# Patient Record
Sex: Female | Born: 1995 | Hispanic: No | Marital: Single | Smoking: Never smoker
Health system: Southern US, Community
[De-identification: ages and names within clinical notes are randomized; demographics above are authoritative.]

---

## 2016-08-15 ENCOUNTER — Encounter (HOSPITAL_COMMUNITY): Payer: Self-pay | Admitting: *Deleted

## 2016-08-15 ENCOUNTER — Emergency Department (HOSPITAL_COMMUNITY)
Admission: EM | Admit: 2016-08-15 | Discharge: 2016-08-15 | Disposition: A | Discharge status: Home / Self Care | Payer: No Typology Code available for payment source | Attending: Emergency Medicine | Admitting: Emergency Medicine

## 2016-08-15 ENCOUNTER — Emergency Department (HOSPITAL_COMMUNITY): Payer: No Typology Code available for payment source

## 2016-08-15 DIAGNOSIS — Z79899 Other long term (current) drug therapy: Secondary | ICD-10-CM | POA: Insufficient documentation

## 2016-08-15 DIAGNOSIS — R51 Headache: Secondary | ICD-10-CM | POA: Diagnosis not present

## 2016-08-15 DIAGNOSIS — M542 Cervicalgia: Secondary | ICD-10-CM | POA: Insufficient documentation

## 2016-08-15 DIAGNOSIS — Y92410 Unspecified street and highway as the place of occurrence of the external cause: Secondary | ICD-10-CM | POA: Diagnosis not present

## 2016-08-15 DIAGNOSIS — Y999 Unspecified external cause status: Secondary | ICD-10-CM | POA: Insufficient documentation

## 2016-08-15 DIAGNOSIS — S20219A Contusion of unspecified front wall of thorax, initial encounter: Secondary | ICD-10-CM | POA: Diagnosis not present

## 2016-08-15 DIAGNOSIS — S299XXA Unspecified injury of thorax, initial encounter: Secondary | ICD-10-CM | POA: Diagnosis present

## 2016-08-15 DIAGNOSIS — Y939 Activity, unspecified: Secondary | ICD-10-CM | POA: Diagnosis not present

## 2016-08-15 MED ORDER — METHOCARBAMOL 500 MG PO TABS: 500.0000 mg | ORAL_TABLET | Freq: Two times a day (BID) | ORAL | 0 refills | Status: AC

## 2016-08-15 MED ORDER — IBUPROFEN 800 MG PO TABS
800.0000 mg | ORAL_TABLET | Freq: Once | ORAL | Status: AC
  Administered 2016-08-15: 800 mg via ORAL
  Filled 2016-08-15: qty 1

## 2016-08-15 MED ORDER — IBUPROFEN 800 MG PO TABS: 800.0000 mg | ORAL_TABLET | Freq: Three times a day (TID) | ORAL | 0 refills | Status: AC

## 2016-08-15 MED ORDER — IOPAMIDOL (ISOVUE-300) INJECTION 61%
INTRAVENOUS | Status: AC
  Administered 2016-08-15: 75 mL via INTRAVENOUS
  Filled 2016-08-15: qty 75
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
## 2016-08-15 NOTE — ED Notes (Signed)
Patient was alert, oriented and stable upon discharge. RN went over AVS and patient had no further questions.  
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
